# Patient Record
Sex: Female | Born: 1965 | Race: White | Hispanic: No | Marital: Married | Smoking: Never smoker
Health system: Southern US, Community
[De-identification: ages and names within clinical notes are randomized; demographics above are authoritative.]

---

## 2018-03-25 DIAGNOSIS — Z01419 Encounter for gynecological examination (general) (routine) without abnormal findings: Secondary | ICD-10-CM | POA: Diagnosis not present

## 2018-03-25 DIAGNOSIS — B370 Candidal stomatitis: Secondary | ICD-10-CM | POA: Diagnosis not present

## 2018-03-25 DIAGNOSIS — Z124 Encounter for screening for malignant neoplasm of cervix: Secondary | ICD-10-CM | POA: Diagnosis not present

## 2018-03-25 DIAGNOSIS — Z6828 Body mass index (BMI) 28.0-28.9, adult: Secondary | ICD-10-CM | POA: Diagnosis not present

## 2018-03-25 DIAGNOSIS — Z1151 Encounter for screening for human papillomavirus (HPV): Secondary | ICD-10-CM | POA: Diagnosis not present

## 2018-04-11 DIAGNOSIS — Z130 Encounter for screening for diseases of the blood and blood-forming organs and certain disorders involving the immune mechanism: Secondary | ICD-10-CM | POA: Diagnosis not present

## 2018-04-11 DIAGNOSIS — Z13220 Encounter for screening for lipoid disorders: Secondary | ICD-10-CM | POA: Diagnosis not present

## 2018-04-11 DIAGNOSIS — Z0000 Encounter for general adult medical examination without abnormal findings: Secondary | ICD-10-CM | POA: Diagnosis not present

## 2018-04-11 DIAGNOSIS — Z131 Encounter for screening for diabetes mellitus: Secondary | ICD-10-CM | POA: Diagnosis not present

## 2018-04-11 DIAGNOSIS — Z1231 Encounter for screening mammogram for malignant neoplasm of breast: Secondary | ICD-10-CM | POA: Diagnosis not present

## 2018-04-11 DIAGNOSIS — Z1329 Encounter for screening for other suspected endocrine disorder: Secondary | ICD-10-CM | POA: Diagnosis not present

## 2018-04-24 ENCOUNTER — Ambulatory Visit (INDEPENDENT_AMBULATORY_CARE_PROVIDER_SITE_OTHER): Payer: BLUE CROSS/BLUE SHIELD

## 2018-04-24 ENCOUNTER — Ambulatory Visit (INDEPENDENT_AMBULATORY_CARE_PROVIDER_SITE_OTHER): Payer: BLUE CROSS/BLUE SHIELD | Admitting: Podiatry

## 2018-04-24 ENCOUNTER — Encounter: Payer: Self-pay | Admitting: Podiatry

## 2018-04-24 DIAGNOSIS — M7751 Other enthesopathy of right foot: Secondary | ICD-10-CM

## 2018-04-24 DIAGNOSIS — M722 Plantar fascial fibromatosis: Secondary | ICD-10-CM | POA: Diagnosis not present

## 2018-04-24 DIAGNOSIS — M779 Enthesopathy, unspecified: Secondary | ICD-10-CM

## 2018-04-24 MED ORDER — MELOXICAM 15 MG PO TABS: 15.0000 mg | ORAL_TABLET | Freq: Every day | ORAL | 0 refills | Status: DC

## 2018-04-24 NOTE — Patient Instructions (Signed)

## 2018-04-24 NOTE — Progress Notes (Signed)
Subjective:    Patient ID: Julia Long, female    DOB: 07-01-1966, 52 y.o.   MRN: [id number]  HPI  52 year old female presents the office of the right foot arch pain.  She states that she broke her ankle about 1 year ago she is having difficulty moving her ankle.  After she broke her ankle she did go to physical therapy but she is not sure if she went long enough.  She states that now both feet are started hurting she points to the heels as well as the arches of both feet.  She also has a bump on the arch of both feet which she has noticed.  The left side started before the right.  Besides the ankle fracture no other recent injury.  She said no recent treatment for this.  No numbness or tingling.  The pain does not wake her up at night.  No other concerns.  Review of Systems  All other systems reviewed and are negative.  History reviewed. No pertinent past medical history.  History reviewed. No pertinent surgical history.   Current Outpatient Medications:  .  meloxicam (MOBIC) 15 MG tablet, Take 1 tablet (15 mg total) by mouth daily., Disp: 30 tablet, Rfl: 0  Not on File       Objective:   Physical Exam General: AAO x3, NAD  Dermatological: Skin is warm, dry and supple bilateral. Nails x 10 are well manicured; remaining integument appears unremarkable at this time. There are no open sores, no preulcerative lesions, no rash or signs of infection present.  Vascular: Dorsalis Pedis artery and Posterior Tibial artery pedal pulses are 2/4 bilateral with immedate capillary fill time. Pedal hair growth present. No varicosities and no lower extremity edema present bilateral. There is no pain with calf compression, swelling, warmth, erythema.   Neruologic: Grossly intact via light touch bilateral.  Protective threshold with Semmes Wienstein monofilament intact to all pedal sites bilateral.   Musculoskeletal: Tenderness to palpation along the plantar medial tubercle of the calcaneus at the  insertion of plantar fascia on the right and left foot. There is mild pain along the course of the plantar fascia within the arch of the foot. Plantar fascia appears to be intact. There is no pain with lateral compression of the calcaneus or pain with vibratory sensation. There is no pain along the course or insertion of the achilles tendon.  Cavus foot type is present.  Ankle joint range of motion is mildly restricted on the right side however there is no crepitation with range of motion.  On the medial band of plantar fascia the arch of the foot bilaterally return from no mobile soft tissue masses consistent with a plantar fibroma.  There is no edema, erythema there is no overlying skin changes present.  No other areas of tenderness to bilateral lower extremities. Muscular strength 5/5 in all groups tested bilateral.  Gait: Unassisted, Nonantalgic.      Assessment & Plan:  52 year old female with bilateral foot pain, plantar fasciitis/plantar fibroma, capsulitis right ankle -Treatment options discussed including all alternatives, risks, and complications -Etiology of symptoms were discussed -X-rays were obtained and reviewed with the patient.  No evidence of acute fracture or stress fracture.  No calcifications or foreign body are present. -She declines steroid injection. -Prescribed mobic. Discussed side effects of the medication and directed to stop if any are to occur and call the office.  -We discussed shoe modifications and orthotics.  Check orthotic coverage. -Plantar fascial brace is  dispensed. -We discussed verapamil cream for the plantar fibroma she wishes to hold off on this -Discussed stretching, icing daily.  Also will start physical therapy.  Prescription for physical therapy was written for [facility]. -RTC 6 weeks or sooner if needed.  Trula Slade DPM

## 2018-04-29 NOTE — Addendum Note (Signed)
Addended by: Harriett Sine D on: 04/29/2018 08:46 AM   Modules accepted: Orders

## 2018-04-30 DIAGNOSIS — M25671 Stiffness of right ankle, not elsewhere classified: Secondary | ICD-10-CM | POA: Diagnosis not present

## 2018-04-30 DIAGNOSIS — M79672 Pain in left foot: Secondary | ICD-10-CM | POA: Diagnosis not present

## 2018-04-30 DIAGNOSIS — M79671 Pain in right foot: Secondary | ICD-10-CM | POA: Diagnosis not present

## 2018-04-30 DIAGNOSIS — M25571 Pain in right ankle and joints of right foot: Secondary | ICD-10-CM | POA: Diagnosis not present

## 2018-05-01 DIAGNOSIS — M25571 Pain in right ankle and joints of right foot: Secondary | ICD-10-CM | POA: Diagnosis not present

## 2018-05-01 DIAGNOSIS — M79671 Pain in right foot: Secondary | ICD-10-CM | POA: Diagnosis not present

## 2018-05-01 DIAGNOSIS — M25671 Stiffness of right ankle, not elsewhere classified: Secondary | ICD-10-CM | POA: Diagnosis not present

## 2018-05-01 DIAGNOSIS — M79672 Pain in left foot: Secondary | ICD-10-CM | POA: Diagnosis not present

## 2018-05-02 ENCOUNTER — Telehealth: Payer: Self-pay | Admitting: Podiatry

## 2018-05-02 NOTE — Telephone Encounter (Signed)
Pt returned call and is aware they are not covered but wants to proceed with orthotics and is aware 398.00. Pt is scheduled to see Liliane Channel on 6.3.19

## 2018-05-02 NOTE — Telephone Encounter (Signed)
Left message for pt that orthotics are not covered unless diabetic and they cost 398.00. Please call if any further questions.

## 2018-05-02 NOTE — Telephone Encounter (Signed)
Thanks

## 2018-05-05 ENCOUNTER — Ambulatory Visit (INDEPENDENT_AMBULATORY_CARE_PROVIDER_SITE_OTHER): Payer: Self-pay | Admitting: Orthotics

## 2018-05-05 DIAGNOSIS — M722 Plantar fascial fibromatosis: Secondary | ICD-10-CM

## 2018-05-05 NOTE — Progress Notes (Signed)
Patient came into today for casting bilateral f/o to address plantar fasciitis.  Patient reports history of foot pain involving plantar aponeurosis.  Goal is to provide longitudinal arch support and correct any RF instability due to heel eversion/inversion.  Ultimate goal is to relieve tension at pf insertion calcaneal tuberosity.  Plan on semi-rigid device addressing heel stability and relieving PF tension.     Also deep heel cup, m/l flange due to high arch supinating type of foot; also hx of ankle fx.

## 2018-05-06 DIAGNOSIS — M79672 Pain in left foot: Secondary | ICD-10-CM | POA: Diagnosis not present

## 2018-05-06 DIAGNOSIS — M25571 Pain in right ankle and joints of right foot: Secondary | ICD-10-CM | POA: Diagnosis not present

## 2018-05-06 DIAGNOSIS — M25671 Stiffness of right ankle, not elsewhere classified: Secondary | ICD-10-CM | POA: Diagnosis not present

## 2018-05-06 DIAGNOSIS — M79671 Pain in right foot: Secondary | ICD-10-CM | POA: Diagnosis not present

## 2018-05-08 DIAGNOSIS — M25571 Pain in right ankle and joints of right foot: Secondary | ICD-10-CM | POA: Diagnosis not present

## 2018-05-08 DIAGNOSIS — M25671 Stiffness of right ankle, not elsewhere classified: Secondary | ICD-10-CM | POA: Diagnosis not present

## 2018-05-08 DIAGNOSIS — M79672 Pain in left foot: Secondary | ICD-10-CM | POA: Diagnosis not present

## 2018-05-08 DIAGNOSIS — M79671 Pain in right foot: Secondary | ICD-10-CM | POA: Diagnosis not present

## 2018-05-13 DIAGNOSIS — M25671 Stiffness of right ankle, not elsewhere classified: Secondary | ICD-10-CM | POA: Diagnosis not present

## 2018-05-13 DIAGNOSIS — M79672 Pain in left foot: Secondary | ICD-10-CM | POA: Diagnosis not present

## 2018-05-13 DIAGNOSIS — M79671 Pain in right foot: Secondary | ICD-10-CM | POA: Diagnosis not present

## 2018-05-13 DIAGNOSIS — M25571 Pain in right ankle and joints of right foot: Secondary | ICD-10-CM | POA: Diagnosis not present

## 2018-05-26 ENCOUNTER — Ambulatory Visit: Payer: BLUE CROSS/BLUE SHIELD | Admitting: Orthotics

## 2018-05-26 DIAGNOSIS — M7751 Other enthesopathy of right foot: Secondary | ICD-10-CM

## 2018-05-26 NOTE — Progress Notes (Signed)
Patient came in today to pick up custom made foot orthotics.  The goals were accomplished and the patient reported no dissatisfaction with said orthotics.  Patient was advised of breakin period and how to report any issues. 

## 2018-05-27 DIAGNOSIS — M25571 Pain in right ankle and joints of right foot: Secondary | ICD-10-CM | POA: Diagnosis not present

## 2018-05-27 DIAGNOSIS — M79672 Pain in left foot: Secondary | ICD-10-CM | POA: Diagnosis not present

## 2018-05-27 DIAGNOSIS — M25671 Stiffness of right ankle, not elsewhere classified: Secondary | ICD-10-CM | POA: Diagnosis not present

## 2018-05-27 DIAGNOSIS — M79671 Pain in right foot: Secondary | ICD-10-CM | POA: Diagnosis not present

## 2018-05-29 DIAGNOSIS — M79671 Pain in right foot: Secondary | ICD-10-CM | POA: Diagnosis not present

## 2018-05-29 DIAGNOSIS — M25571 Pain in right ankle and joints of right foot: Secondary | ICD-10-CM | POA: Diagnosis not present

## 2018-05-29 DIAGNOSIS — M79672 Pain in left foot: Secondary | ICD-10-CM | POA: Diagnosis not present

## 2018-05-29 DIAGNOSIS — M25671 Stiffness of right ankle, not elsewhere classified: Secondary | ICD-10-CM | POA: Diagnosis not present

## 2018-06-09 ENCOUNTER — Ambulatory Visit: Payer: BLUE CROSS/BLUE SHIELD | Admitting: Podiatry

## 2018-06-25 ENCOUNTER — Ambulatory Visit: Payer: BLUE CROSS/BLUE SHIELD | Admitting: Podiatry

## 2018-07-07 ENCOUNTER — Ambulatory Visit (INDEPENDENT_AMBULATORY_CARE_PROVIDER_SITE_OTHER): Payer: BLUE CROSS/BLUE SHIELD | Admitting: Podiatry

## 2018-07-07 ENCOUNTER — Encounter: Payer: Self-pay | Admitting: Podiatry

## 2018-07-07 DIAGNOSIS — M722 Plantar fascial fibromatosis: Secondary | ICD-10-CM

## 2018-07-07 MED ORDER — MELOXICAM 15 MG PO TABS: 15.0000 mg | ORAL_TABLET | Freq: Every day | ORAL | 0 refills | Status: DC

## 2018-07-08 DIAGNOSIS — M25571 Pain in right ankle and joints of right foot: Secondary | ICD-10-CM | POA: Diagnosis not present

## 2018-07-08 DIAGNOSIS — M79672 Pain in left foot: Secondary | ICD-10-CM | POA: Diagnosis not present

## 2018-07-08 DIAGNOSIS — M79671 Pain in right foot: Secondary | ICD-10-CM | POA: Diagnosis not present

## 2018-07-08 DIAGNOSIS — M25671 Stiffness of right ankle, not elsewhere classified: Secondary | ICD-10-CM | POA: Diagnosis not present

## 2018-07-09 DIAGNOSIS — M722 Plantar fascial fibromatosis: Secondary | ICD-10-CM | POA: Insufficient documentation

## 2018-07-09 NOTE — Progress Notes (Signed)
Subjective: 52 year old female presents the office today for follow-up evaluation of bladder fasciitis on the right side.  She says that she is doing physical therapy and she was doing well but she has been in [place] for 7 weeks helping her brother renovate a house and so therefore she has not been doing exercises and not taking care of her feet.  She does have a new prescription for physical therapy as she is feels that her right foot feels weak.  Also she is asking for refill the meloxicam is been very helpful for her.  Overall her pain is better but she still gets some discomfort.  She has no new concerns otherwise no recent injury. Denies any systemic complaints such as fevers, chills, nausea, vomiting. No acute changes since last appointment, and no other complaints at this time.   Objective: AAO x3, NAD DP/PT pulses palpable bilaterally, CRT less than 3 seconds There is tenderness palpation on the plantar medial tubercle of the calcaneus at the insertion of the plantar fascia on the feet.  No pain on the arch of the foot.  Achilles tendon appears to be intact.  Flexor, extensor tendons intact.  There is no edema, erythema, increase in warmth.  Negative Tinel sign. No open lesions or pre-ulcerative lesions.  No pain with calf compression, swelling, warmth, erythema  Assessment: Bilateral heel pain, plantar fasciitis  Plan: -All treatment options discussed with the patient including all alternatives, risks, complications.  -At this time she wants to continue physical therapy given the "weakness" on the right side.  New prescription was given to [facility] and she is scheduled to start that this week.  Also refilled the meloxicam for her to take as needed.  Continue home stretching, icing as well as wearing supportive shoes and orthotics. -Patient encouraged to call the office with any questions, concerns, change in symptoms.   Trula Slade DPM

## 2018-07-10 DIAGNOSIS — M79672 Pain in left foot: Secondary | ICD-10-CM | POA: Diagnosis not present

## 2018-07-10 DIAGNOSIS — M25671 Stiffness of right ankle, not elsewhere classified: Secondary | ICD-10-CM | POA: Diagnosis not present

## 2018-07-10 DIAGNOSIS — M25571 Pain in right ankle and joints of right foot: Secondary | ICD-10-CM | POA: Diagnosis not present

## 2018-07-10 DIAGNOSIS — M79671 Pain in right foot: Secondary | ICD-10-CM | POA: Diagnosis not present

## 2018-07-22 DIAGNOSIS — M25571 Pain in right ankle and joints of right foot: Secondary | ICD-10-CM | POA: Diagnosis not present

## 2018-07-22 DIAGNOSIS — M79671 Pain in right foot: Secondary | ICD-10-CM | POA: Diagnosis not present

## 2018-07-22 DIAGNOSIS — M79672 Pain in left foot: Secondary | ICD-10-CM | POA: Diagnosis not present

## 2018-07-22 DIAGNOSIS — M25671 Stiffness of right ankle, not elsewhere classified: Secondary | ICD-10-CM | POA: Diagnosis not present

## 2018-08-03 ENCOUNTER — Other Ambulatory Visit: Payer: Self-pay | Admitting: Podiatry

## 2018-09-26 ENCOUNTER — Ambulatory Visit (INDEPENDENT_AMBULATORY_CARE_PROVIDER_SITE_OTHER): Payer: BLUE CROSS/BLUE SHIELD | Admitting: Podiatry

## 2018-09-26 DIAGNOSIS — M722 Plantar fascial fibromatosis: Secondary | ICD-10-CM

## 2018-09-26 MED ORDER — MELOXICAM 15 MG PO TABS: 15.0000 mg | ORAL_TABLET | Freq: Every day | ORAL | 0 refills | Status: DC

## 2018-09-28 NOTE — Progress Notes (Signed)
Subjective: 52 year old female presents the office today for follow-up evaluation of plantar fasciitis.  She states that she was not consistent with her treatment previously and she still having some pain.  She states is been wearing inserts.  She states that she still feels tight and she was seen by the physical therapy.  She also states that she wears the orthotics but she is not sure how comfortable they are.  She does not wear them all the time.  Denies any systemic complaints such as fevers, chills, nausea, vomiting. No acute changes since last appointment, and no other complaints at this time.   Objective: AAO x3, NAD DP/PT pulses palpable bilaterally, CRT less than 3 seconds Tenderness to palpation along the plantar medial tubercle of the calcaneus at the insertion of plantar fascia on the right foot. There is no pain along the course of the plantar fascia within the arch of the foot. Plantar fascia appears to be intact. There is no pain with lateral compression of the calcaneus or pain with vibratory sensation. There is no pain along the course or insertion of the achilles tendon. No other areas of tenderness to bilateral lower extremities. Negative Tinel sign No open lesions or pre-ulcerative lesions.  No pain with calf compression, swelling, warmth, erythema  Assessment: Plantar fasciitis  Plan: -All treatment options discussed with the patient including all alternatives, risks, complications.  -Discussion regards to treatment options.  I want her to bring the orthotics back into see her back for possible modifications.  Also she was about a physical therapy and a prescription for [facility] was written.  She states that she is doing more consistent with treatment at this point.  We held off on an injection.  Refilled meloxicam to use as needed.  Continue plantar fascial brace as needed known her to continue with stretching, rehab exercises at home until she gets into  physical therapy. -Patient encouraged to call the office with any questions, concerns, change in symptoms.  -RTC 6 weeks or sooner if needed  Trula Slade DPM

## 2018-11-07 ENCOUNTER — Encounter: Payer: Self-pay | Admitting: Podiatry

## 2018-11-07 ENCOUNTER — Ambulatory Visit (INDEPENDENT_AMBULATORY_CARE_PROVIDER_SITE_OTHER): Payer: BLUE CROSS/BLUE SHIELD | Admitting: Podiatry

## 2018-11-07 DIAGNOSIS — D3610 Benign neoplasm of peripheral nerves and autonomic nervous system, unspecified: Secondary | ICD-10-CM | POA: Diagnosis not present

## 2018-11-07 DIAGNOSIS — M722 Plantar fascial fibromatosis: Secondary | ICD-10-CM | POA: Diagnosis not present

## 2018-11-09 NOTE — Progress Notes (Signed)
Subjective: 52 year old female presents the office today for follow-up evaluation of plantar fasciitis.  She states that her heels about 95% better physical therapy is been very helpful for her.  However she has new concerns over some burning to her third and fourth toes on the left foot as well.  She states that her physical therapist told her she might have a neuroma and she wants me to check this.  Her therapist has been working on this as well and the symptoms have been improving. Denies any systemic complaints such as fevers, chills, nausea, vomiting. No acute changes since last appointment, and no other complaints at this time.   Objective: AAO x3, NAD DP/PT pulses palpable bilaterally, CRT less than 3 seconds There is very minimal to no discomfort on the plantar medial tubercle of the calcaneus at the insertion of plantar fashion on the left foot.  No pain with lateral compression of the calcaneus.  No pain along the Achilles tendon.  There is a small palpable neuroma identified in the third interspace of the left foot.  There is no area pinpoint tenderness.  No edema. The plantar fibromas are much improved as well and no pain.  No open lesions or pre-ulcerative lesions.  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  -Overall she is doing much better.  I discussed a steroid injection for the neuroma but she wishes to hold off on this.  She states is getting better with the physical therapy.  Continue therapy for both issues.  I will follow-up with her after the physical therapy or sooner if any issues are to arise if there is any worsening.  She agrees with this plan and she has no further questions or concerns today. -Patient encouraged to call the office with any questions, concerns, change in  symptoms.   Trula Slade DPM

## 2018-11-24 ENCOUNTER — Other Ambulatory Visit: Payer: Self-pay | Admitting: Podiatry

## 2019-08-13 ENCOUNTER — Ambulatory Visit (INDEPENDENT_AMBULATORY_CARE_PROVIDER_SITE_OTHER): Payer: BC Managed Care – PPO

## 2019-08-13 ENCOUNTER — Other Ambulatory Visit: Payer: Self-pay

## 2019-08-13 ENCOUNTER — Ambulatory Visit (INDEPENDENT_AMBULATORY_CARE_PROVIDER_SITE_OTHER): Payer: BC Managed Care – PPO | Admitting: Podiatry

## 2019-08-13 DIAGNOSIS — M7751 Other enthesopathy of right foot: Secondary | ICD-10-CM

## 2019-08-13 DIAGNOSIS — M25571 Pain in right ankle and joints of right foot: Secondary | ICD-10-CM

## 2019-08-13 DIAGNOSIS — D3610 Benign neoplasm of peripheral nerves and autonomic nervous system, unspecified: Secondary | ICD-10-CM

## 2019-08-14 ENCOUNTER — Telehealth: Payer: Self-pay | Admitting: *Deleted

## 2019-08-14 DIAGNOSIS — M25571 Pain in right ankle and joints of right foot: Secondary | ICD-10-CM

## 2019-08-14 NOTE — Progress Notes (Signed)
Subjective: 52 year old female presents the office today for concerns of stiffness and pain in the right ankle joint.  She states that she had an ankle fracture about 2 years ago while in [place] that underwent open reduction internal fixation.  I been seeing him for plantar fasciitis/year as well as 1 Morton's neuroma.  She is doing physical therapy which is very helpful however the last several months she has not been doing therapy and she thinks this is why very stiff.  She describes some discomfort upon dorsiflexion of the ankle joint.  Still has some discomfort at the neuroma. Denies any systemic complaints such as fevers, chills, nausea, vomiting. No acute changes since last appointment, and no other complaints at this time.   Objective: AAO x3, NAD DP/PT pulses palpable bilaterally, CRT less than 3 seconds There is tenderness on the anterior aspect of the ankle joint there is tenderness on dorsiflexion of the ankle joint.  This is more than range of motion.  There is no pain at medial, lateral malleoli or along the Achilles tendon.  Flexor, extensor tendons appear to be intact.  Mild discomfort in the foot on the Morton's neuroma.  No other areas of tenderness elicited at this time. No pain with calf compression, swelling, warmth, erythema  Assessment: Right ankle joint stiffness/pain, rule out osteochondral lesion, structural issue causing restriction of range of motion  Plan: -All treatment options discussed with the patient including all alternatives, risks, complications.  -X-rays reviewed.  Fracture is healed.  Hardware intact ankle.  Decreasing the joint space on both the medial lateral gutters.  Difficult to evaluate anterior ankle joint given hardware -Given her ongoing pain despite doing physical therapy, shoe modifications will order CT scan of the ankle in order to evaluate the ankle joint.  This is to the osteochondral lesion as well as possible spurring of the  anterior aspect of the ankle joint causing a structural reason why there is restriction of motion and pain in range of motion. -Patient encouraged to call the office with any questions, concerns, change in symptoms.   *If we go back to physical therapy we will do [facility].  Also we discussed dry needling for the neuroma.  Trula Slade DPM

## 2019-08-14 NOTE — Telephone Encounter (Signed)
Orders to Gretta Arab, RN for pre-cert, and faxed to [facility].

## 2019-08-14 NOTE — Telephone Encounter (Signed)
-----   Message from Trula Slade, DPM sent at 08/14/2019  7:11 AM EDT ----- Can you please order a CT scan of the right ankle? Previous ankle fracture with ORIF with continued pain, stiffness. She has tried PT, shoe changes. Thanks.

## 2019-08-24 ENCOUNTER — Telehealth: Payer: Self-pay | Admitting: *Deleted

## 2019-08-24 NOTE — Telephone Encounter (Signed)
Called and spoke with Melissa from [facility] and she stated that the CT authorization was exempt from the insurance company due to the company makes own rules and doesn't utilizes the vendor and the reference number is [id number]. Lattie Haw

## 2019-08-27 ENCOUNTER — Ambulatory Visit
Admission: RE | Admit: 2019-08-27 | Discharge: 2019-08-27 | Disposition: A | Discharge status: Home / Self Care | Payer: BC Managed Care – PPO | Source: Ambulatory Visit | Attending: Podiatry | Admitting: Podiatry

## 2019-08-27 DIAGNOSIS — M25571 Pain in right ankle and joints of right foot: Secondary | ICD-10-CM

## 2019-08-31 ENCOUNTER — Telehealth: Payer: Self-pay | Admitting: *Deleted

## 2019-08-31 NOTE — Telephone Encounter (Signed)
I reviewed pt's LOV 08/13/2019 and Dr. Leigh Aurora explanation of pt's current foot and ankle problem was clearly stated with his recommended treatment. I called pt and asked if I could send a copy of the [place] 08/13/2019 note. Pt states that would be fine. Faxed orders and LOV 08/13/2019 to [facility].

## 2019-08-31 NOTE — Telephone Encounter (Signed)
Pt called for results.

## 2019-08-31 NOTE — Telephone Encounter (Signed)
I informed pt of Dr. Leigh Aurora review of results and pt states understanding.

## 2019-08-31 NOTE — Telephone Encounter (Signed)
Left message for pt to call for CT results.

## 2019-08-31 NOTE — Telephone Encounter (Signed)
-----   Message from Trula Slade, DPM sent at 08/28/2019  7:03 AM EDT ----- Val- please let her know that CT scan showed healed fractures. There is a small bone fragmenet in the ankle joint but that should not be causing the restriction. I would like to re-start PT and I think she is in agreement to this but I wanted to check a CT before doing this. Can you please let her know the results and put in a referral for [facility]? She didn't want to use [facility].

## 2019-10-28 ENCOUNTER — Encounter: Payer: Self-pay | Admitting: Cardiology

## 2019-10-28 ENCOUNTER — Ambulatory Visit (INDEPENDENT_AMBULATORY_CARE_PROVIDER_SITE_OTHER): Payer: BC Managed Care – PPO | Admitting: Cardiology

## 2019-10-28 ENCOUNTER — Other Ambulatory Visit: Payer: Self-pay

## 2019-10-28 VITALS — BP 137/77 | HR 65 | Ht 69.0 in | Wt 188.4 lb

## 2019-10-28 DIAGNOSIS — R002 Palpitations: Secondary | ICD-10-CM | POA: Diagnosis not present

## 2019-10-28 DIAGNOSIS — Z7189 Other specified counseling: Secondary | ICD-10-CM | POA: Diagnosis not present

## 2019-10-28 NOTE — Patient Instructions (Signed)
Medication Instructions:  Your Physician recommend you continue on your current medication as directed.    *If you need a refill on your cardiac medications before your next appointment, please call your pharmacy*  Lab Work: None  Testing/Procedures: Our physician has recommended that you wear an [place] monitor. The Zio patch cardiac monitor continuously records heart rhythm data for up to 14 days, this is for patients being evaluated for multiple types heart rhythms. For the first 24 hours post application, please avoid getting the Zio monitor wet in the shower or by excessive sweating during exercise. After that, feel free to carry on with regular activities. Keep soaps and lotions away from the ZIO XT Patch.   Someone will call you to have monitor mailed.   Follow-Up: At [facility], you and your health needs are our priority.  As part of our continuing mission to provide you with exceptional heart care, we have created designated Provider Care Teams.  These Care Teams include your primary Cardiologist (physician) and Advanced Practice Providers (APPs -  Physician Assistants and Nurse Practitioners) who all work together to provide you with the care you need, when you need it.  Your next appointment:   As needed  The format for your next appointment:   Either In Person or Virtual  Provider:   Buford Dresser, MD   [place] Instructions   Your physician has requested you wear your ZIO patch monitor__14_____days.   This is a single patch monitor.  [employer] supplies one patch monitor per enrollment.  Additional stickers are not available.   Please do not apply patch if you will be having a Nuclear Stress Test, Echocardiogram, Cardiac CT, MRI, or Chest Xray during the time frame you would be wearing the monitor. The patch cannot be worn during these tests.  You cannot remove and re-apply the ZIO XT patch monitor.   Your ZIO patch monitor will be  sent USPS Priority mail from [facility] directly to your home address. The monitor may also be mailed to a PO BOX if home delivery is not available.   It may take 3-5 days to receive your monitor after you have been enrolled.   Once you have received you monitor, please review enclosed instructions.  Your monitor has already been registered assigning a specific monitor serial # to you.   Applying the monitor   Shave hair from upper left chest.   Hold abrader disc by orange tab.  Rub abrader in 40 strokes over left upper chest as indicated in your monitor instructions.   Clean area with 4 enclosed alcohol pads .  Use all pads to assure are is cleaned thoroughly.  Let dry.   Apply patch as indicated in monitor instructions.  Patch will be place under collarbone on left side of chest with arrow pointing upward.   Rub patch adhesive wings for 2 minutes.Remove white label marked "1".  Remove white label marked "2".  Rub patch adhesive wings for 2 additional minutes.   While looking in a mirror, press and release button in center of patch.  A small green light will flash 3-4 times .  This will be your only indicator the monitor has been turned on.     Do not shower for the first 24 hours.  You may shower after the first 24 hours.   Press button if you feel a symptom. You will hear a small click.  Record Date, Time and Symptom  in the Patient Log Book.   When you are ready to remove patch, follow instructions on last 2 pages of Patient Log Book.  Stick patch monitor onto last page of Patient Log Book.   Place Patient Log Book in [place] box.  Use locking tab on box and tape box closed securely.  The [employer] and [employer] has [employer] on it.  Please place in mailbox as soon as possible.  Your physician should have your test results approximately 7 days after the monitor has been mailed back to [facility].   Call [facility] at [phone] if you have  questions regarding your ZIO XT patch monitor.  Call them immediately if you see an orange light blinking on your monitor.   If your monitor falls off in less than 4 days contact our Monitor department at [phone].  If your monitor becomes loose or falls off after 4 days call [employer] at [phone] for suggestions on securing your monitor.

## 2019-10-28 NOTE — Progress Notes (Signed)
Cardiology Office Note:    Date:  10/28/2019   ID:  Julia Long, DOB [birth year], MRN [id number]  PCP:  Patient, No Pcp Per  Cardiologist:  Buford Dresser, MD  Referring MD: No ref. provider found   CC: new patient/establish care  History of Present Illness:    Julia Long is a 53 y.o. female with a hx of palpitations who is seen as a new patient for the evaluation and management of palpitaitons.  Was seen at [facility] for general checkup/ECG recently. No records available. Was seen 3-4 years ago in [facility] but she cannot recall where it was to get records. Has tests 4 years ago for similar episode of palpitations and was told things were fine. Had stress test and monitor done.   Her main concern is palpitations. These are not consistent, occur at rest. Sometimes every day, then sometimes not for days. "Feels like adrenaline got kicked on and can't shut off."  Started back up about 6-9 mos ago. Longest lasting is 1-2 hours, comes and goes. Better with getting up and moving around. No syncope. Denies personal history of cardiac issues  Denies thyroid issues, diabetes, CKD, etc. Diet has been poor, walks for exercise, working on improving this.   Family history: mat gpa died in his 29s of MI. No other heart disease.  Pandemic has been stressful for her.   Has some minor vertigo with changing position (has had in the past). ROS otherwise negative.  Denies chest pain, shortness of breath at rest or with normal exertion. No PND, orthopnea, LE edema or unexpected weight gain. No syncope or palpitations.  History reviewed. No pertinent past medical history.  History reviewed. No pertinent surgical history.  Current Medications: Current Outpatient Medications on File Prior to Visit  Medication Sig  . cholecalciferol (VITAMIN D3) 25 MCG (1000 UT) tablet Take 1,000 Units by mouth daily.   No current facility-administered medications on file prior to visit.      Allergies:    Patient has no known allergies.   Social History   Tobacco Use  . Smoking status: Never Smoker  . Smokeless tobacco: Never Used  Substance Use Topics  . Alcohol use: Yes    Alcohol/week: 2.0 standard drinks    Types: 2 Glasses of wine per week  . Drug use: Not on file    Family History: family history includes Heart attack in her maternal grandfather.  ROS:   Please see the history of present illness.  Additional pertinent ROS: Constitutional: Negative for chills, fever, night sweats, unintentional weight loss  HENT: Negative for ear pain and hearing loss.   Eyes: Negative for loss of vision and eye pain.  Respiratory: Negative for cough, sputum, wheezing.   Cardiovascular: See HPI. Gastrointestinal: Negative for abdominal pain, melena, and hematochezia.  Genitourinary: Negative for dysuria and hematuria.  Musculoskeletal: Negative for falls and myalgias.  Skin: Negative for itching and rash.  Neurological: Negative for focal weakness, focal sensory changes and loss of consciousness.  Endo/Heme/Allergies: Does not bruise/bleed easily.     EKGs/Labs/Other Studies Reviewed:    The following studies were reviewed today: No prior studies available  EKG:  EKG is personally reviewed.  The ekg ordered today demonstrates NSR  Recent Labs: No results found for requested labs within last 8760 hours.  Recent Lipid Panel No results found for: CHOL, TRIG, HDL, CHOLHDL, VLDL, LDLCALC, LDLDIRECT  Physical Exam:    VS:  BP 137/77   Pulse 65   Ht 5'  9" (1.753 m)   Wt 188 lb 6.4 oz (85.5 kg)   SpO2 98%   BMI 27.82 kg/m     Wt Readings from Last 3 Encounters:  10/28/19 188 lb 6.4 oz (85.5 kg)    GEN: Well nourished, well developed in no acute distress HEENT: Normal, moist mucous membranes NECK: No JVD CARDIAC: regular rhythm, normal S1 and S2, no rubs or gallops. No murmurs. VASCULAR: Radial and DP pulses 2+ bilaterally. No carotid bruits RESPIRATORY:  Clear to auscultation  without rales, wheezing or rhonchi  ABDOMEN: Soft, non-tender, non-distended MUSCULOSKELETAL:  Ambulates independently SKIN: Warm and dry, no edema NEUROLOGIC:  Alert and oriented x 3. No focal neuro deficits noted. PSYCHIATRIC:  Normal affect    ASSESSMENT:    1. Palpitation   2. Cardiac risk counseling   3. Counseling on health promotion and disease prevention    PLAN:    Palpitations: discussed possible etiologies at length today, as well as ways of evaluting -2 week Zio, echo if abnormal -if no significant rhythm abnormalities, likely noncardiac in etiology  Cardiac risk counseling and prevention recommendations: -recommend heart healthy/Mediterranean diet, with whole grains, fruits, vegetable, fish, lean meats, nuts, and olive oil. Limit salt. -recommend moderate walking, 3-5 times/week for 30-50 minutes each session. Aim for at least 150 minutes.week. Goal should be pace of 3 miles/hours, or walking 1.5 miles in 30 minutes -recommend avoidance of tobacco products. Avoid excess alcohol. -ASCVD risk score: The ASCVD Risk score Mikey Bussing DC Jr., et al., 2013) failed to calculate for the following reasons:   Cannot find a previous HDL lab   Cannot find a previous total cholesterol lab    Plan for follow up: PRN if monitor normal  Medication Adjustments/Labs and Tests Ordered: Current medicines are reviewed at length with the patient today.  Concerns regarding medicines are outlined above.  Orders Placed This Encounter  Procedures  . LONG TERM MONITOR (3-14 DAYS)  . EKG 12-Lead   No orders of the defined types were placed in this encounter.   Patient Instructions  Medication Instructions:  Your Physician recommend you continue on your current medication as directed.    *If you need a refill on your cardiac medications before your next appointment, please call your pharmacy*  Lab Work: None  Testing/Procedures: Our physician has recommended that you wear an [place] monitor. The Zio patch cardiac monitor continuously records heart rhythm data for up to 14 days, this is for patients being evaluated for multiple types heart rhythms. For the first 24 hours post application, please avoid getting the Zio monitor wet in the shower or by excessive sweating during exercise. After that, feel free to carry on with regular activities. Keep soaps and lotions away from the ZIO XT Patch.   Someone will call you to have monitor mailed.   Follow-Up: At [facility], you and your health needs are our priority.  As part of our continuing mission to provide you with exceptional heart care, we have created designated Provider Care Teams.  These Care Teams include your primary Cardiologist (physician) and Advanced Practice Providers (APPs -  Physician Assistants and Nurse Practitioners) who all work together to provide you with the care you need, when you need it.  Your next appointment:   As needed  The format for your next appointment:   Either In Person or Virtual  Provider:   Buford Dresser, MD   [place] Instructions   Your physician  has requested you wear your ZIO patch monitor__14_____days.   This is a single patch monitor.  [employer] supplies one patch monitor per enrollment.  Additional stickers are not available.   Please do not apply patch if you will be having a Nuclear Stress Test, Echocardiogram, Cardiac CT, MRI, or Chest Xray during the time frame you would be wearing the monitor. The patch cannot be worn during these tests.  You cannot remove and re-apply the ZIO XT patch monitor.   Your ZIO patch monitor will be sent USPS Priority mail from [facility] directly to your home address. The monitor may also be mailed to a PO BOX if home delivery is not available.   It may take 3-5 days to receive your monitor after you have been enrolled.   Once you have received you monitor, please review enclosed instructions.   Your monitor has already been registered assigning a specific monitor serial # to you.   Applying the monitor   Shave hair from upper left chest.   Hold abrader disc by orange tab.  Rub abrader in 40 strokes over left upper chest as indicated in your monitor instructions.   Clean area with 4 enclosed alcohol pads .  Use all pads to assure are is cleaned thoroughly.  Let dry.   Apply patch as indicated in monitor instructions.  Patch will be place under collarbone on left side of chest with arrow pointing upward.   Rub patch adhesive wings for 2 minutes.Remove white label marked "1".  Remove white label marked "2".  Rub patch adhesive wings for 2 additional minutes.   While looking in a mirror, press and release button in center of patch.  A small green light will flash 3-4 times .  This will be your only indicator the monitor has been turned on.     Do not shower for the first 24 hours.  You may shower after the first 24 hours.   Press button if you feel a symptom. You will hear a small click.  Record Date, Time and Symptom in the Patient Log Book.   When you are ready to remove patch, follow instructions on last 2 pages of Patient Log Book.  Stick patch monitor onto last page of Patient Log Book.   Place Patient Log Book in [place] box.  Use locking tab on box and tape box closed securely.  The [employer] and [employer] has [employer] on it.  Please place in mailbox as soon as possible.  Your physician should have your test results approximately 7 days after the monitor has been mailed back to [facility].   Call [place] at [phone] if you have questions regarding your ZIO XT patch monitor.  Call them immediately if you see an orange light blinking on your monitor.   If your monitor falls off in less than 4 days contact our Monitor department at [phone].  If your monitor becomes loose or falls off after 4 days call [employer] at [phone] for  suggestions on securing your monitor.        Signed, Buford Dresser, MD PhD 10/28/2019 9:20 AM    [place]

## 2019-11-02 ENCOUNTER — Encounter: Payer: Self-pay | Admitting: *Deleted

## 2019-11-02 NOTE — Progress Notes (Signed)
Patient ID: Julia Long, female   DOB: 1965-12-09, 53 y.o.   MRN: [id number]  14 day ZIO XT long term holter monitor to be mailed to the patients home.

## 2019-11-05 ENCOUNTER — Other Ambulatory Visit: Payer: Self-pay

## 2019-11-05 DIAGNOSIS — Z20822 Contact with and (suspected) exposure to covid-19: Secondary | ICD-10-CM

## 2019-11-09 LAB — NOVEL CORONAVIRUS, NAA: SARS-CoV-2, NAA: DETECTED — AB

## 2019-11-10 ENCOUNTER — Telehealth: Payer: Self-pay

## 2019-11-10 ENCOUNTER — Encounter: Payer: Self-pay | Admitting: *Deleted

## 2019-11-10 NOTE — Telephone Encounter (Signed)
Pt notified of positive COVID-19 test results. Pt verbalized understanding. Pt reports that she has cold symptoms.Pt advised to remain in self quarantine until at least 10 days since symptom onset And 3 consecutive days fever free without antipyretics And improvement in respiratory symptoms. Patient advised to utilize over the counter medications to treat symptoms. Pt advised to seek treatment in the ED if respiratory issues/distress develops.Pt advised they should only leave home to seek and medical care and must wear a mask in public. Pt instructed to limit contact with family members or caregivers in the home. Pt advised to practice social distancing and to continue to use good preventative care measures such has frequent hand washing, staying out of crowds and cleaning hard surfaces frequently touched in the home.Pt informed that the health department will likely follow up and may have additional recommendations. Will notify [facility] Department.

## 2019-11-11 ENCOUNTER — Ambulatory Visit (INDEPENDENT_AMBULATORY_CARE_PROVIDER_SITE_OTHER): Payer: BC Managed Care – PPO

## 2019-11-11 DIAGNOSIS — R002 Palpitations: Secondary | ICD-10-CM | POA: Diagnosis not present

## 2020-03-31 ENCOUNTER — Ambulatory Visit: Payer: BC Managed Care – PPO | Admitting: Podiatry

## 2021-01-19 ENCOUNTER — Ambulatory Visit: Payer: Self-pay

## 2021-01-19 ENCOUNTER — Encounter: Payer: Self-pay | Admitting: Orthopedic Surgery

## 2021-01-19 ENCOUNTER — Ambulatory Visit: Payer: BC Managed Care – PPO | Admitting: Orthopedic Surgery

## 2021-01-19 DIAGNOSIS — M25561 Pain in right knee: Secondary | ICD-10-CM | POA: Diagnosis not present

## 2021-01-19 DIAGNOSIS — M25562 Pain in left knee: Secondary | ICD-10-CM

## 2021-01-19 DIAGNOSIS — M25532 Pain in left wrist: Secondary | ICD-10-CM

## 2021-01-19 DIAGNOSIS — G8929 Other chronic pain: Secondary | ICD-10-CM

## 2021-01-19 NOTE — Progress Notes (Signed)
Office Visit Note   Patient: Julia Long           Date of Birth: December 13, 1965           MRN: [id number] Visit Date: 01/19/2021              Requested by: No referring provider defined for this encounter. PCP: Patient, No Pcp Per  Chief Complaint  Patient presents with  . Right Knee - Pain  . Left Knee - Pain  . Left Wrist - Pain      HPI: Patient is a 55 year old woman who presents complaining of bilateral patellofemoral pain as well as pain over the first dorsal extensor compartment of the left wrist.  Patient states she dropped a water bottle on her wrist and states that the knee pain started after she has started exercising with a trainer.  Assessment & Plan: Visit Diagnoses:  1. Chronic pain of both knees   2. Pain in left wrist     Plan: Patient was given instructions and demonstrated isometric single leg exercises as well as 6 dynamic single-leg exercises to work on the patellofemoral strengthening.  Recommended Voltaren gel for the wrist for the irritation of the first dorsal extensor compartment.  Follow-Up Instructions: Return if symptoms worsen or fail to improve.   Ortho Exam  Patient is alert, oriented, no adenopathy, well-dressed, normal affect, normal respiratory effort. Examination of the left wrist she has no pain to palpation of the scaphoid's Aveline or TFCC.  She has some mild tenderness to palpation over the first dorsal extensor compartment the superficial radial nerve is nontender to palpation.  Examination of both knees she has some mild crepitation in the patellofemoral joint with range of motion the patella tracks midline collaterals and cruciates are stable there is no effusion no redness no abrasions.  Imaging: XR Knee 1-2 Views Left  Result Date: 01/19/2021 2 view radiographs of the left knee shows a congruent joint no periarticular bony spurs no subchondral cysts.  XR Knee 1-2 Views Right  Result Date: 01/19/2021 2 view radiographs of the  right knee shows a congruent joint no bone spurs no periarticular cystic changes or sclerotic changes  XR Wrist 2 Views Left  Result Date: 01/19/2021 2 view radiographs of the left wrist shows normal anatomy without fractures.  No images are attached to the encounter.  Labs: No results found for: HGBA1C, ESRSEDRATE, CRP, LABURIC, REPTSTATUS, GRAMSTAIN, CULT, LABORGA   No results found for: ALBUMIN, PREALBUMIN, LABURIC  No results found for: MG No results found for: VD25OH  No results found for: PREALBUMIN No flowsheet data found.   There is no height or weight on file to calculate BMI.  Orders:  Orders Placed This Encounter  Procedures  . XR Knee 1-2 Views Left  . XR Knee 1-2 Views Right  . XR Wrist 2 Views Left   No orders of the defined types were placed in this encounter.    Procedures: No procedures performed  Clinical Data: No additional findings.  ROS:  All other systems negative, except as noted in the HPI. Review of Systems  Objective: Vital Signs: There were no vitals taken for this visit.  Specialty Comments:  No specialty comments available.  PMFS History: Patient Active Problem List   Diagnosis Date Noted  . Plantar fasciitis 07/09/2018   No past medical history on file.  Family History  Problem Relation Age of Onset  . Heart attack Maternal Grandfather     No past  surgical history on file. Social History   Occupational History  . Not on file  Tobacco Use  . Smoking status: Never Smoker  . Smokeless tobacco: Never Used  Substance and Sexual Activity  . Alcohol use: Yes    Alcohol/week: 2.0 standard drinks    Types: 2 Glasses of wine per week  . Drug use: Not on file  . Sexual activity: Not on file

## 2021-02-24 IMAGING — CT CT ANKLE*R* W/O CM
3 series · 12 of 33 positions shown, 14 images · non-contrast
Comparison: 08/13/2019

CLINICAL DATA: History of ankle fracture status post ORIF.
Persistent pain

EXAM:
CT OF THE RIGHT ANKLE WITHOUT CONTRAST
TECHNIQUE: Multidetector CT imaging of the right ankle was performed according
to the standard protocol. Multiplanar CT image reconstructions were
also generated.

[Series 5: sfov lower extremity 2.00 br40 s3 soft (person_nam · axial · 0.21mm/px · z∈[+560,+686]mm · 4 of 91 slices shown, 5 images (1 of 3)]
[im 14/91  soft-tissue]
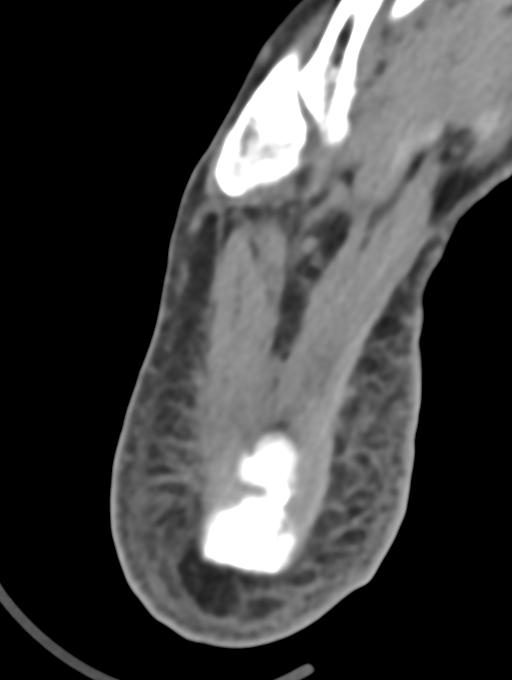
[im 14/91  bone]
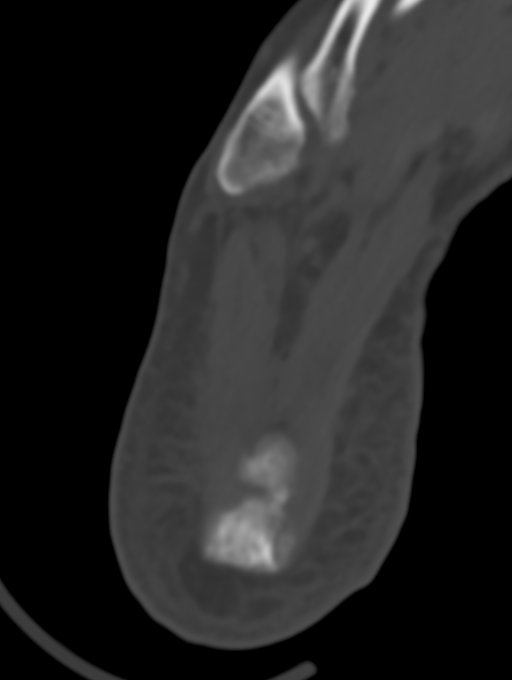
[im 35/91  bone]
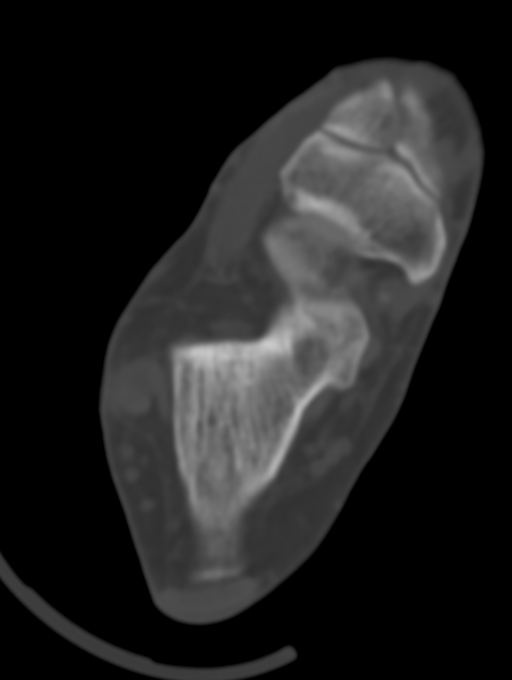
[im 56/91  bone]
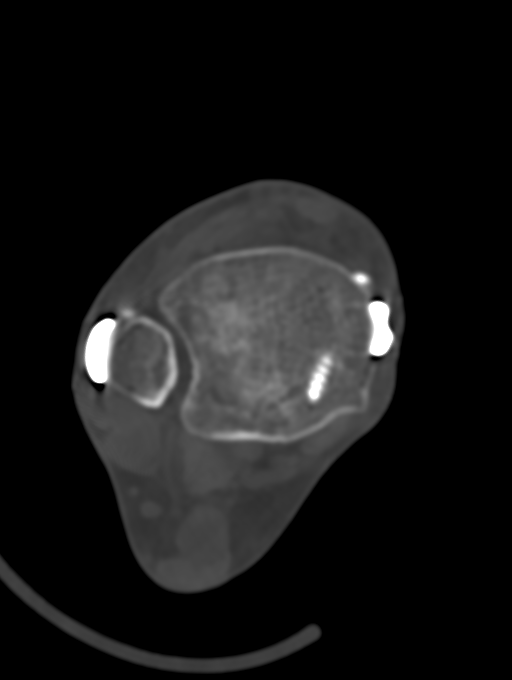
[im 77/91  bone]
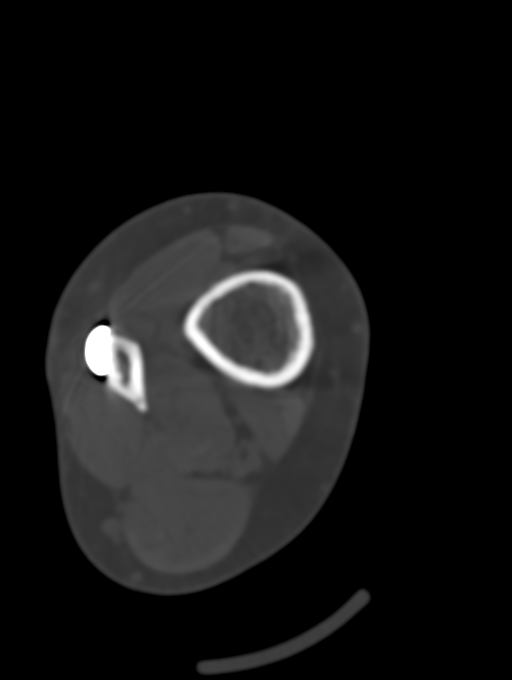

[Series 9: sfov lower extremity 2.00 br40 s3 soft (person_nam · coronal · 0.21mm/px · 3 of 62 slices shown (2 of 3)]
[im 13/62  bone]
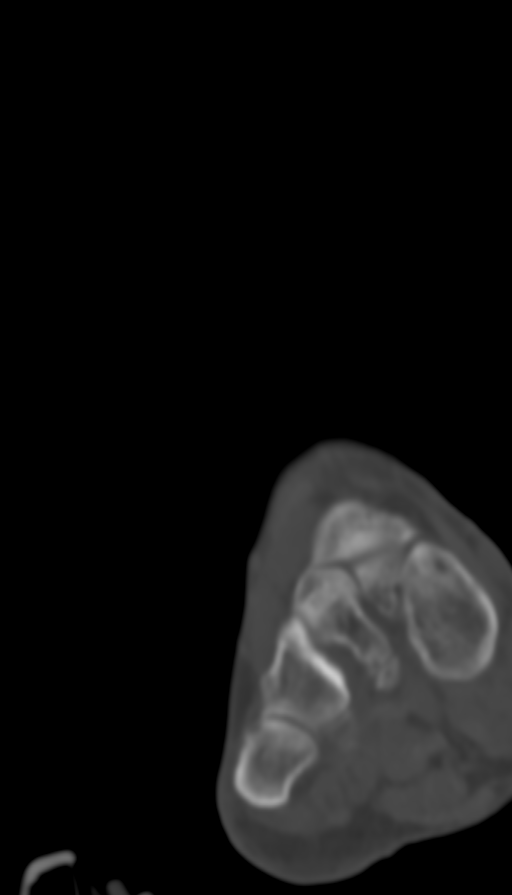
[im 25/62  bone]
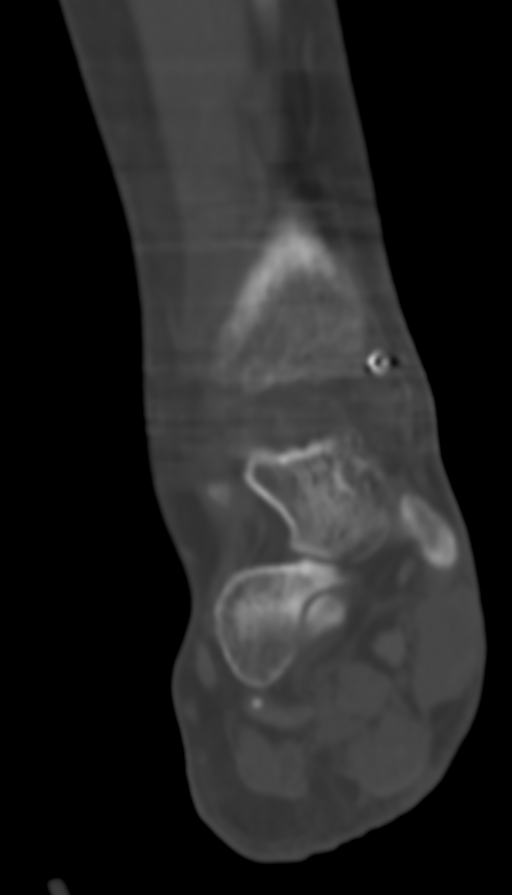
[im 37/62  bone]
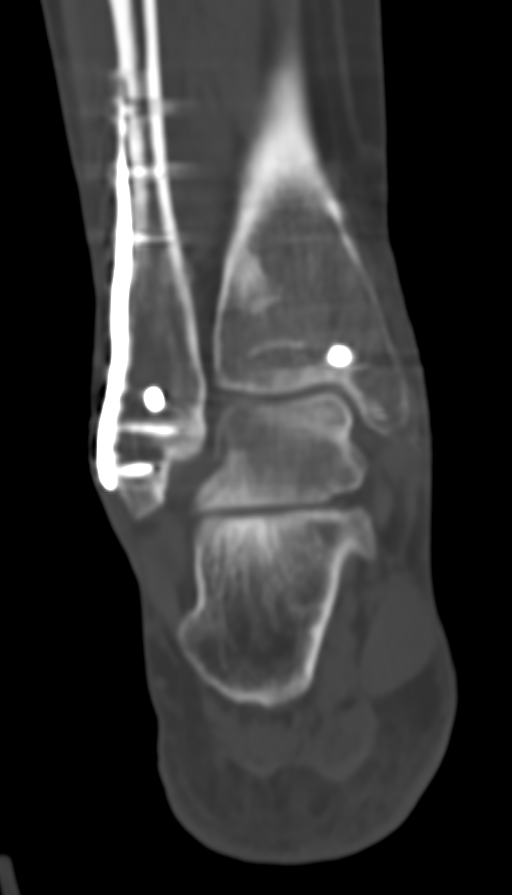

[Series 13: sfov lower extremity 2.00 br40 s3 soft (person_nam · sagittal · 0.27mm/px · 5 of 52 slices shown, 6 images (3 of 3)]
[im 18/52  bone]
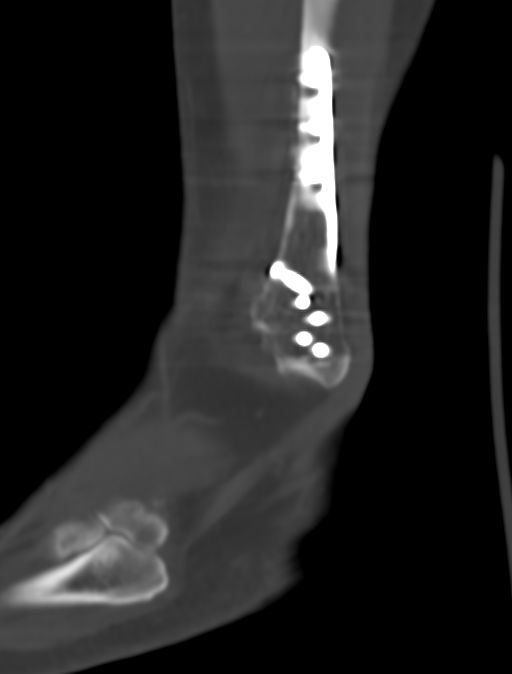
[im 22/52  bone]
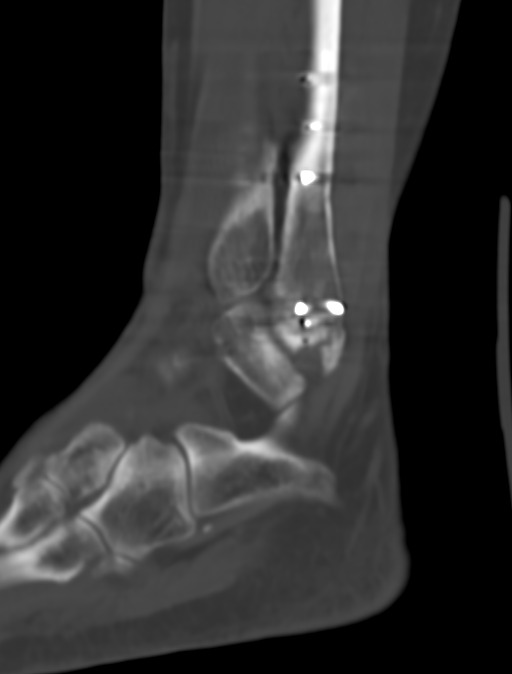
[im 26/52  soft-tissue]
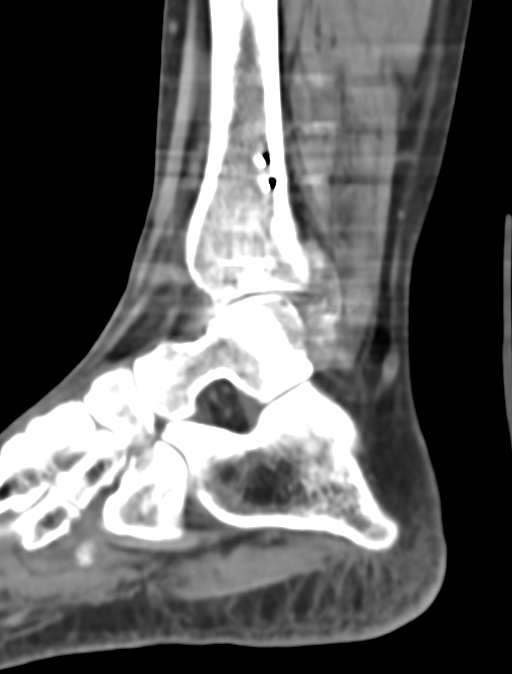
[im 26/52  bone]
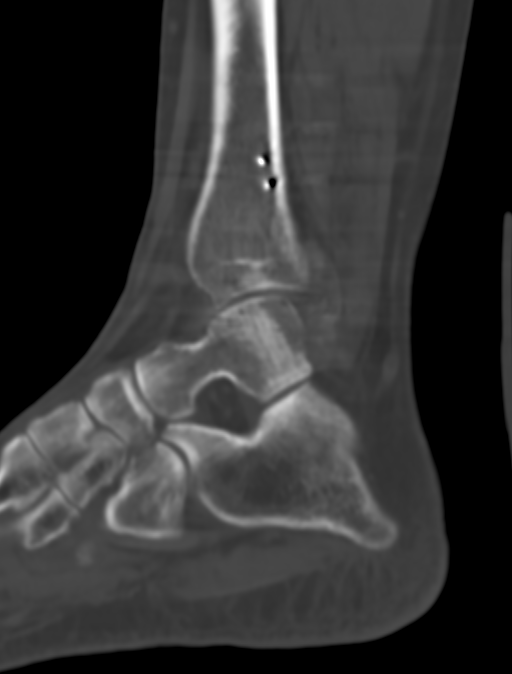
[im 30/52  bone]
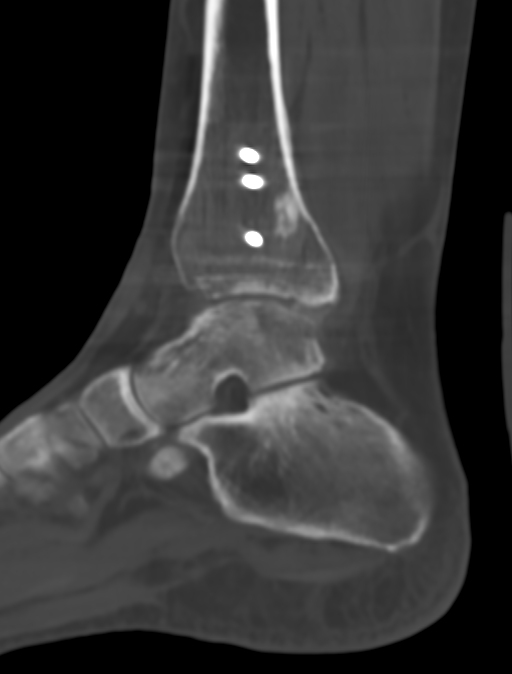
[im 35/52  bone]
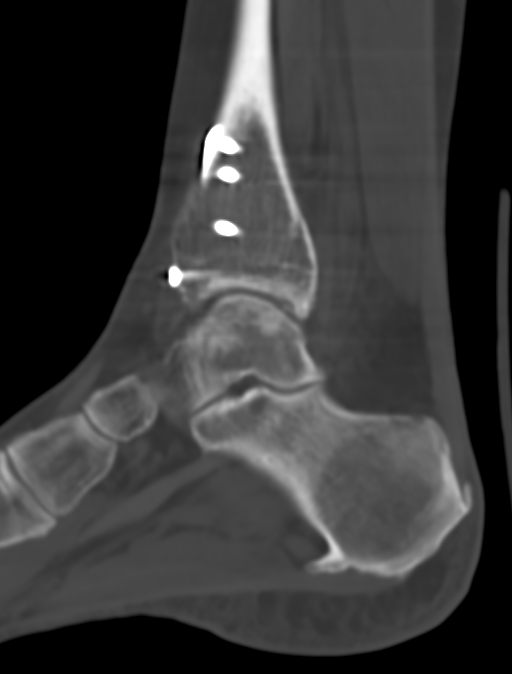

[12 of 33 positions shown; findings below may reference images not displayed]

FINDINGS: Bones/Joint/Cartilage

Remote prior ankle fracture status post ORIF with medial side plate
and screw fixation construct at the distal tibia and a lateral
sideplate and screw fixation construct of the distal fibula.
Hardware intact without evidence of complication. Fractures are well
healed with minimal residual fracture line evident at the posterior
and medial malleolus with mature bridging bone.

No acute fractures. No dislocation. Ankle mortise is congruent with
preservation of the tibiotalar joint space. 5 x 3 mm mineralized
intra-articular body at the posteromedial tibiotalar joint space
(series 11, image 37). No talar dome osteochondral lesion. Subtalar
joint space is maintained.

Ligaments

Suboptimally assessed by CT.

Muscles and Tendons

Ankle tendons including the Achilles tendon appear grossly intact.
No muscular atrophy or fatty infiltration.

Soft tissues

No soft tissue hematoma or fluid collections.
IMPRESSION: 1. Well-healed fractures of the right ankle status post ORIF.
Hardware intact without evidence of complication.
2. Small intra-articular loose body at the posteromedial aspect of
the tibiotalar joint.
3. Bidirectional calcaneal enthesopathy.

## 2021-06-12 ENCOUNTER — Ambulatory Visit: Payer: BC Managed Care – PPO | Admitting: Podiatry

## 2021-06-22 ENCOUNTER — Other Ambulatory Visit: Payer: Self-pay

## 2021-06-22 ENCOUNTER — Ambulatory Visit: Payer: BC Managed Care – PPO | Admitting: Podiatry

## 2021-06-22 DIAGNOSIS — B351 Tinea unguium: Secondary | ICD-10-CM

## 2021-06-22 DIAGNOSIS — M79674 Pain in right toe(s): Secondary | ICD-10-CM | POA: Diagnosis not present

## 2021-06-22 DIAGNOSIS — M79675 Pain in left toe(s): Secondary | ICD-10-CM

## 2021-06-22 DIAGNOSIS — L600 Ingrowing nail: Secondary | ICD-10-CM | POA: Diagnosis not present

## 2021-06-22 NOTE — Patient Instructions (Signed)
Soak Instructions    THE DAY AFTER THE PROCEDURE  Place 1/4 cup of epsom salts in a quart of warm tap water.  Submerge your foot or feet with outer bandage intact for the initial soak; this will allow the bandage to become moist and wet for easy lift off.  Once you remove your bandage, continue to soak in the solution for 20 minutes.  This soak should be done twice a day.  Next, remove your foot or feet from solution, blot dry the affected area and cover.  You may use a band aid large enough to cover the area or use gauze and tape.  Apply other medications to the area as directed by the doctor such as polysporin neosporin.  IF YOUR SKIN BECOMES IRRITATED WHILE USING THESE INSTRUCTIONS, IT IS OKAY TO SWITCH TO  WHITE VINEGAR AND WATER. Or you may use antibacterial soap and water to keep the toe clean  Monitor for any signs/symptoms of infection. Call the office immediately if any occur or go directly to the emergency room. Call with any questions/concerns. Ingrown Toenail An ingrown toenail occurs when the corner or sides of a toenail grow into the surrounding skin. This causes discomfort and pain. The big toe is most commonly affected, but any of the toes can be affected. If an ingrown toenail is nottreated, it can become infected. What are the causes? This condition may be caused by: Wearing shoes that are too small or tight. An injury, such as stubbing your toe or having your toe stepped on. Improper cutting or care of your toenails. Having nail or foot abnormalities that were present from birth (congenital abnormalities), such as having a nail that is too big for your toe. What increases the risk? The following factors may make you more likely to develop ingrown toenails: Age. Nails tend to get thicker with age, so ingrown nails are more common among older people. Cutting your toenails incorrectly, such as cutting them very short or cutting them unevenly. An ingrown toenail is more likely  to get infected if you have: Diabetes. Blood flow (circulation) problems. What are the signs or symptoms? Symptoms of an ingrown toenail may include: Pain, soreness, or tenderness. Redness. Swelling. Hardening of the skin that surrounds the toenail. Signs that an ingrown toenail may be infected include: Fluid or pus. Symptoms that get worse instead of better. How is this diagnosed? An ingrown toenail may be diagnosed based on your medical history, your symptoms, and a physical exam. If you have fluid or blood coming from your toenail, a sample may be collected to test for the specific type of bacteriathat is causing the infection. How is this treated? Treatment depends on how severe your ingrown toenail is. You may be able to care for your toenail at home. If you have an infection, you may be prescribed antibiotic medicines. If you have fluid or pus draining from your toenail, your health care provider may drain it. If you have trouble walking, you may be given crutches to use. If you have a severe or infected ingrown toenail, you may need a procedure to remove part or all of the nail. Follow these instructions at home: Foot care  Do not pick at your toenail or try to remove it yourself. Soak your foot in warm, soapy water. Do this for 20 minutes, 3 times a day, or as often as told by your health care provider. This helps to keep your toe clean and keep your skin soft. Wear  shoes that fit well and are not too tight. Your health care provider may recommend that you wear open-toed shoes while you heal. Trim your toenails regularly and carefully. Cut your toenails straight across to prevent injury to the skin at the corners of the toenail. Do not cut your nails in a curved shape. Keep your feet clean and dry to help prevent infection.  Medicines Take over-the-counter and prescription medicines only as told by your health care provider. If you were prescribed an antibiotic, take it as told  by your health care provider. Do not stop taking the antibiotic even if you start to feel better. Activity Return to your normal activities as told by your health care provider. Ask your health care provider what activities are safe for you. Avoid activities that cause pain. General instructions If your health care provider told you to use crutches to help you move around, use them as instructed. Keep all follow-up visits as told by your health care provider. This is important. Contact a health care provider if: You have more redness, swelling, pain, or other symptoms that do not improve with treatment. You have fluid, blood, or pus coming from your toenail. Get help right away if: You have a red streak on your skin that starts at your foot and spreads up your leg. You have a fever. Summary An ingrown toenail occurs when the corner or sides of a toenail grow into the surrounding skin. This causes discomfort and pain. The big toe is most commonly affected, but any of the toes can be affected. If an ingrown toenail is not treated, it can become infected. Fluid or pus draining from your toenail is a sign of infection. Your health care provider may need to drain it. You may be given antibiotics to treat the infection. Trimming your toenails regularly and properly can help you prevent an ingrown toenail. This information is not intended to replace advice given to you by your health care provider. Make sure you discuss any questions you have with your healthcare provider. Document Revised: 03/13/2019 Document Reviewed: 08/07/2017 Elsevier Patient Education  2021 [employer].

## 2021-06-26 NOTE — Progress Notes (Signed)
Subjective: 55 year old female presents the office today for concerns of toenails becoming thickened discolored and also her toenails becoming ingrown along both corners of both big toes.  No drainage or pus or any swelling or redness.  There is tender with pressure. Denies any systemic complaints such as fevers, chills, nausea, vomiting. No acute changes since last appointment, and no other complaints at this time.   Objective: AAO x3, NAD DP/PT pulses palpable bilaterally, CRT less than 3 seconds Health nails are hypertrophic, dystrophic with brown discoloration.  Incurvation of the nails present with tenderness palpation along the nail borders.  No edema, erythema, drainage or pus or any signs of infection.  No pain with calf compression, swelling, warmth, erythema  Assessment: Onychodystrophy with ingrown toenails  Plan: -All treatment options discussed with the patient including all alternatives, risks, complications.  -Regards to the nail discomfort I discussed partial nail avulsion with chemical matricectomy.  She wants to proceed with this but she is going to go on vacation.  We will plan to do this when she gets back. -We will send the nail for culture after the procedure -Patient encouraged to call the office with any questions, concerns, change in symptoms.   Trula Slade DPM

## 2021-07-17 ENCOUNTER — Encounter: Payer: Self-pay | Admitting: Podiatry

## 2021-07-17 ENCOUNTER — Ambulatory Visit: Payer: BC Managed Care – PPO | Admitting: Podiatry

## 2021-07-17 ENCOUNTER — Other Ambulatory Visit: Payer: Self-pay

## 2021-07-17 DIAGNOSIS — M79675 Pain in left toe(s): Secondary | ICD-10-CM

## 2021-07-17 DIAGNOSIS — L603 Nail dystrophy: Secondary | ICD-10-CM | POA: Diagnosis not present

## 2021-07-17 DIAGNOSIS — B351 Tinea unguium: Secondary | ICD-10-CM

## 2021-07-17 DIAGNOSIS — M79674 Pain in right toe(s): Secondary | ICD-10-CM

## 2021-07-17 DIAGNOSIS — L600 Ingrowing nail: Secondary | ICD-10-CM

## 2021-07-17 NOTE — Patient Instructions (Addendum)

## 2021-07-19 NOTE — Progress Notes (Signed)
Subjective: 55 year old female presents the office today for concerns of ingrown toenails with her big toes and she wants to have the procedure in order to remove them as are causing discomfort.  She was soaking in salt water which is been helpful and she denies any swelling or redness or any drainage at this time. Denies any systemic complaints such as fevers, chills, nausea, vomiting. No acute changes since last appointment, and no other complaints at this time.   Objective: AAO x3, NAD DP/PT pulses palpable bilaterally, CRT less than 3 seconds Incurvation present to both the medial lateral aspects of bilateral hallux toenails without any edema, erythema or any signs of infection.  Tenderness palpation the nail borders.  No other areas of discomfort.  The nails are mildly dystrophic with slight yellow discoloration of the nail borders.  No pain with calf compression, swelling, warmth, erythema  Assessment: 55 year old female ingrown toenails bilaterally, possible onychomycosis  Plan: -All treatment options discussed with the patient including all alternatives, risks, complications.  -At this time, the patient is requesting partial nail removal with chemical matricectomy to the symptomatic portion of the nail. Risks and complications were discussed with the patient for which they understand and written consent was obtained. Under sterile conditions a total of 3 mL of a mixture of 2% lidocaine plain and 0.5% Marcaine plain was infiltrated in a hallux block fashion. Once anesthetized, the skin was prepped in sterile fashion. A tourniquet was then applied. Next the medial and lateral aspect of hallux nail border was then sharply excised making sure to remove the entire offending nail border. Once the nails were ensured to be removed area was debrided and the underlying skin was intact. There is no purulence identified in the procedure. Next phenol was then applied under standard conditions and copiously  irrigated. Silvadene was applied. A dry sterile dressing was applied. After application of the dressing the tourniquet was removed and there is found to be an immediate capillary refill time to the digit. The patient tolerated the procedure well any complications. Post procedure instructions were discussed the patient for which he verbally understood. Follow-up in one week for nail check or sooner if any problems are to arise. Discussed signs/symptoms of infection and directed to call the office immediately should any occur or go directly to the emergency room. In the meantime, encouraged to call the office with any questions, concerns, changes symptoms. -Discussed treatment options for nail fungus but will see if the nail culture, biopsy results show which I sent to [facility] today.  -Patient encouraged to call the office with any questions, concerns, change in symptoms.   Trula Slade DPM

## 2021-08-01 ENCOUNTER — Other Ambulatory Visit: Payer: Self-pay

## 2021-08-01 ENCOUNTER — Ambulatory Visit: Payer: BC Managed Care – PPO | Admitting: Podiatry

## 2021-08-01 ENCOUNTER — Encounter: Payer: Self-pay | Admitting: Podiatry

## 2021-08-01 DIAGNOSIS — L600 Ingrowing nail: Secondary | ICD-10-CM | POA: Diagnosis not present

## 2021-08-01 DIAGNOSIS — B351 Tinea unguium: Secondary | ICD-10-CM | POA: Diagnosis not present

## 2021-08-08 NOTE — Progress Notes (Signed)
Subjective: 55 year old female presents the office today for follow-up evaluation of ingrown toenails as well as for nail culture results.  She states the nail sites are doing better.  Denies any drainage or pus.  She has stopped soaking.  No pain.   Objective: AAO x3, NAD DP/PT pulses palpable bilaterally, CRT less than 3 seconds Status post partial nail avulsion bilaterally.  Small emulation tissue and scab is still present left side as well as the right.  On the right proximal nail base there is a small amount of erythema but there is no ascending cellulitis.  No drainage or pus or other signs of infection.  No pain with calf compression, swelling, warmth, erythema  Assessment:  55 year old female with status post ingrown toenail removal, onychomycosis  Plan: -All treatment options discussed with the patient including all alternatives, risks, complications.  -I recommend going back to Epson salt soaks as well as cover with a small amount of antibiotic ointment during the day leave the area open at nighttime.  Monitor the redness on the right side Any worsening let me know if is not resolved in the next 2 weeks. -Reviewed nail culture results with her.  Discussed her medications patient was to try natural remedies first.  Discussed biotin supplement as well as different topical that she can apply more naturally.  If no improvement will consider medications. -Patient encouraged to call the office with any questions, concerns, change in symptoms.   Trula Slade DPM

## 2021-09-15 DIAGNOSIS — Z124 Encounter for screening for malignant neoplasm of cervix: Secondary | ICD-10-CM | POA: Diagnosis not present

## 2021-09-15 DIAGNOSIS — Z1231 Encounter for screening mammogram for malignant neoplasm of breast: Secondary | ICD-10-CM | POA: Diagnosis not present

## 2021-09-15 DIAGNOSIS — Z113 Encounter for screening for infections with a predominantly sexual mode of transmission: Secondary | ICD-10-CM | POA: Diagnosis not present

## 2021-09-15 DIAGNOSIS — Z01419 Encounter for gynecological examination (general) (routine) without abnormal findings: Secondary | ICD-10-CM | POA: Diagnosis not present

## 2021-09-15 DIAGNOSIS — Z6821 Body mass index (BMI) 21.0-21.9, adult: Secondary | ICD-10-CM | POA: Diagnosis not present

## 2022-01-04 DIAGNOSIS — L720 Epidermal cyst: Secondary | ICD-10-CM | POA: Diagnosis not present

## 2022-01-04 DIAGNOSIS — L821 Other seborrheic keratosis: Secondary | ICD-10-CM | POA: Diagnosis not present

## 2022-01-04 DIAGNOSIS — L814 Other melanin hyperpigmentation: Secondary | ICD-10-CM | POA: Diagnosis not present

## 2022-01-04 DIAGNOSIS — D2372 Other benign neoplasm of skin of left lower limb, including hip: Secondary | ICD-10-CM | POA: Diagnosis not present

## 2022-02-05 DIAGNOSIS — R300 Dysuria: Secondary | ICD-10-CM | POA: Diagnosis not present

## 2022-02-05 DIAGNOSIS — N390 Urinary tract infection, site not specified: Secondary | ICD-10-CM | POA: Diagnosis not present

## 2022-02-05 DIAGNOSIS — T3695XA Adverse effect of unspecified systemic antibiotic, initial encounter: Secondary | ICD-10-CM | POA: Diagnosis not present

## 2022-02-05 DIAGNOSIS — B379 Candidiasis, unspecified: Secondary | ICD-10-CM | POA: Diagnosis not present

## 2022-10-02 DIAGNOSIS — Z13220 Encounter for screening for lipoid disorders: Secondary | ICD-10-CM | POA: Diagnosis not present

## 2022-10-02 DIAGNOSIS — Z1329 Encounter for screening for other suspected endocrine disorder: Secondary | ICD-10-CM | POA: Diagnosis not present

## 2022-10-02 DIAGNOSIS — Z1231 Encounter for screening mammogram for malignant neoplasm of breast: Secondary | ICD-10-CM | POA: Diagnosis not present

## 2022-10-02 DIAGNOSIS — Z01419 Encounter for gynecological examination (general) (routine) without abnormal findings: Secondary | ICD-10-CM | POA: Diagnosis not present

## 2022-10-02 DIAGNOSIS — Z0000 Encounter for general adult medical examination without abnormal findings: Secondary | ICD-10-CM | POA: Diagnosis not present

## 2022-10-02 DIAGNOSIS — Z6823 Body mass index (BMI) 23.0-23.9, adult: Secondary | ICD-10-CM | POA: Diagnosis not present

## 2022-10-02 DIAGNOSIS — Z131 Encounter for screening for diabetes mellitus: Secondary | ICD-10-CM | POA: Diagnosis not present

## 2023-02-11 DIAGNOSIS — D225 Melanocytic nevi of trunk: Secondary | ICD-10-CM | POA: Diagnosis not present

## 2023-02-11 DIAGNOSIS — D2262 Melanocytic nevi of left upper limb, including shoulder: Secondary | ICD-10-CM | POA: Diagnosis not present

## 2023-02-11 DIAGNOSIS — L720 Epidermal cyst: Secondary | ICD-10-CM | POA: Diagnosis not present

## 2023-02-11 DIAGNOSIS — D2261 Melanocytic nevi of right upper limb, including shoulder: Secondary | ICD-10-CM | POA: Diagnosis not present

## 2023-04-16 DIAGNOSIS — Z23 Encounter for immunization: Secondary | ICD-10-CM | POA: Diagnosis not present

## 2023-04-16 DIAGNOSIS — E559 Vitamin D deficiency, unspecified: Secondary | ICD-10-CM | POA: Diagnosis not present

## 2023-04-16 DIAGNOSIS — Z0000 Encounter for general adult medical examination without abnormal findings: Secondary | ICD-10-CM | POA: Diagnosis not present

## 2023-04-16 DIAGNOSIS — E611 Iron deficiency: Secondary | ICD-10-CM | POA: Diagnosis not present

## 2023-04-16 DIAGNOSIS — E7800 Pure hypercholesterolemia, unspecified: Secondary | ICD-10-CM | POA: Diagnosis not present

## 2023-04-17 ENCOUNTER — Other Ambulatory Visit: Payer: Self-pay | Admitting: Family Medicine

## 2023-04-17 DIAGNOSIS — E2839 Other primary ovarian failure: Secondary | ICD-10-CM

## 2023-06-12 ENCOUNTER — Ambulatory Visit: Payer: BC Managed Care – PPO | Admitting: Sports Medicine

## 2023-06-12 VITALS — BP 110/70 | Ht 69.0 in | Wt 160.0 lb

## 2023-06-12 DIAGNOSIS — R269 Unspecified abnormalities of gait and mobility: Secondary | ICD-10-CM

## 2023-06-12 NOTE — Assessment & Plan Note (Signed)
Given series of general strength exercises to prepare for marathon  Use of sports insoles with scaphoid pads appears adequate  If problems arise we will move to custom orthotics

## 2023-06-12 NOTE — Progress Notes (Signed)
PCP: Camie Patience, FNP  Subjective:   HPI: Patient is a 57 y.o. female here for gait analysis.  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  She has previously done half marathons.  Denies any acute or recent pain.  She does point out that she has calluses that form on the insides of her great toes bilaterally when she runs.  She has a history of right ankle fracture.  She uses Dr. Margart Sickles inserts in her shoes for arch support.  No past medical history on file.  Current Outpatient Medications on File Prior to Visit  Medication Sig Dispense Refill   cholecalciferol (VITAMIN D3) 25 MCG (1000 UT) tablet Take 1,000 Units by mouth daily.     nitrofurantoin, macrocrystal-monohydrate, (MACROBID) 100 MG capsule Macrobid 100 mg capsule  Take 1 capsule every 12 hours by oral route for 3 days.     No current facility-administered medications on file prior to visit.    No past surgical history on file.  No Known Allergies  BP 110/70   Ht 5\' 9"  (1.753 m)   Wt 160 lb (72.6 kg)   BMI 23.63 kg/m       No data to display              No data to display              Objective:  Physical Exam:  Gen: NAD, comfortable in exam room  Bilateral hips: 5/5 strength Full range of motion  Bilateral feet: Superficial abrasions/calluses noted on the medial surface of bilateral great toes.  No evidence of hallux valgus bilaterally. Appears to have cavus foot bilaterally with high arches Full range of motion of the ankle and midfoot.  Nontender to palpation throughout.  Sensation and pulses intact  Running gait shows evidence of slight forefoot overpronation bilaterally, worse on her right   Assessment & Plan:  1.  Overpronation -her gait seems to be affected by the shape of her feet/arches resulting in overpronation.  This is evidenced by the calluses forming on the inside of her great toes bilaterally.  Provided patient with insoles with arch support; use of these  new insoles improved her gait upon repeat analysis.  Also provided patient with exercises to strengthen her thigh/hip muscles to further reinforce her gait as she trains for the marathon.  Follow-up as needed  I observed and examined the patient with the resident and agree with assessment and plan.  Note reviewed and modified by me. KB Fields. MD

## 2023-07-15 DIAGNOSIS — E559 Vitamin D deficiency, unspecified: Secondary | ICD-10-CM | POA: Diagnosis not present

## 2023-07-15 DIAGNOSIS — Z23 Encounter for immunization: Secondary | ICD-10-CM | POA: Diagnosis not present

## 2023-07-15 DIAGNOSIS — E611 Iron deficiency: Secondary | ICD-10-CM | POA: Diagnosis not present

## 2023-07-15 DIAGNOSIS — E7800 Pure hypercholesterolemia, unspecified: Secondary | ICD-10-CM | POA: Diagnosis not present

## 2023-07-23 ENCOUNTER — Other Ambulatory Visit (HOSPITAL_COMMUNITY): Payer: Self-pay | Admitting: Family Medicine

## 2023-07-23 DIAGNOSIS — E7800 Pure hypercholesterolemia, unspecified: Secondary | ICD-10-CM

## 2023-08-01 ENCOUNTER — Ambulatory Visit (HOSPITAL_BASED_OUTPATIENT_CLINIC_OR_DEPARTMENT_OTHER)
Admission: RE | Admit: 2023-08-01 | Discharge: 2023-08-01 | Disposition: A | Discharge status: Home / Self Care | Payer: BC Managed Care – PPO | Source: Ambulatory Visit | Attending: Family Medicine | Admitting: Family Medicine

## 2023-08-01 DIAGNOSIS — E7800 Pure hypercholesterolemia, unspecified: Secondary | ICD-10-CM | POA: Insufficient documentation

## 2023-08-09 DIAGNOSIS — M25561 Pain in right knee: Secondary | ICD-10-CM | POA: Diagnosis not present

## 2023-08-09 DIAGNOSIS — M25562 Pain in left knee: Secondary | ICD-10-CM | POA: Diagnosis not present

## 2023-10-04 DIAGNOSIS — Z1331 Encounter for screening for depression: Secondary | ICD-10-CM | POA: Diagnosis not present

## 2023-10-04 DIAGNOSIS — Z01419 Encounter for gynecological examination (general) (routine) without abnormal findings: Secondary | ICD-10-CM | POA: Diagnosis not present

## 2023-10-04 DIAGNOSIS — E559 Vitamin D deficiency, unspecified: Secondary | ICD-10-CM | POA: Diagnosis not present

## 2023-10-04 DIAGNOSIS — Z1231 Encounter for screening mammogram for malignant neoplasm of breast: Secondary | ICD-10-CM | POA: Diagnosis not present

## 2023-10-04 DIAGNOSIS — Z130 Encounter for screening for diseases of the blood and blood-forming organs and certain disorders involving the immune mechanism: Secondary | ICD-10-CM | POA: Diagnosis not present

## 2023-10-15 ENCOUNTER — Ambulatory Visit (INDEPENDENT_AMBULATORY_CARE_PROVIDER_SITE_OTHER): Payer: BC Managed Care – PPO | Admitting: Orthopaedic Surgery

## 2023-10-15 ENCOUNTER — Other Ambulatory Visit (INDEPENDENT_AMBULATORY_CARE_PROVIDER_SITE_OTHER): Payer: BC Managed Care – PPO

## 2023-10-15 ENCOUNTER — Other Ambulatory Visit (INDEPENDENT_AMBULATORY_CARE_PROVIDER_SITE_OTHER): Payer: Self-pay

## 2023-10-15 ENCOUNTER — Encounter: Payer: Self-pay | Admitting: Orthopaedic Surgery

## 2023-10-15 DIAGNOSIS — M25522 Pain in left elbow: Secondary | ICD-10-CM | POA: Diagnosis not present

## 2023-10-15 NOTE — Progress Notes (Signed)
Office Visit Note   Patient: Julia Long           Date of Birth: 26-Apr-1966           MRN: [id number] Visit Date: 10/15/2023              Requested by: Camie Patience, FNP [address] [place],  [place] [address] PCP: Camie Patience, FNP   Assessment & Plan: Visit Diagnoses:  1. Pain in left elbow     Plan: Julia Long is a 57 year old female with left elbow pain.  Impression is that she has either an occult radial head fracture or avulsion fracture of the lateral epicondyle.  This can be treated conservatively and symptomatically.  Activity restrictions reviewed.  I would like to recheck her in 3 weeks with three-view x-rays of the left elbow.  Follow-Up Instructions: Return in about 3 weeks (around 11/05/2023).   Orders:  Orders Placed This Encounter  Procedures   XR Elbow 2 Views Left   XR Forearm Left   No orders of the defined types were placed in this encounter.     Procedures: No procedures performed   Clinical Data: No additional findings.   Subjective: Chief Complaint  Patient presents with   Left Shoulder - Pain    HPI Julia Long is a 57 year old female here for evaluation of left upper extremity pain.  She was running a half marathon in [place] 3 and half weeks ago when she fell.  She had x-rays which were reportedly negative.  She has pain that radiates down the arm when she holds or grasps anything.  She has been taking over-the-counter medications.  Review of Systems  Constitutional: Negative.   HENT: Negative.    Eyes: Negative.   Respiratory: Negative.    Cardiovascular: Negative.   Endocrine: Negative.   Musculoskeletal: Negative.   Neurological: Negative.   Hematological: Negative.   Psychiatric/Behavioral: Negative.    All other systems reviewed and are negative.    Objective: Vital Signs: There were no vitals taken for this visit.  Physical Exam Vitals and nursing note reviewed.  Constitutional:      Appearance:  She is well-developed.  HENT:     Head: Atraumatic.     Nose: Nose normal.  Eyes:     Extraocular Movements: Extraocular movements intact.  Cardiovascular:     Pulses: Normal pulses.  Pulmonary:     Effort: Pulmonary effort is normal.  Abdominal:     Palpations: Abdomen is soft.  Musculoskeletal:     Cervical back: Neck supple.  Skin:    General: Skin is warm.     Capillary Refill: Capillary refill takes less than 2 seconds.  Neurological:     Mental Status: She is alert. Mental status is at baseline.  Psychiatric:        Behavior: Behavior normal.        Thought Content: Thought content normal.        Judgment: Judgment normal.     Ortho Exam Exam of the left upper extremity shows a normal shoulder exam with excellent strength.  Exam of the left elbow shows mild limitation with extension and flexion and pronation supination with radiation of pain up into the bicep and down to the forearm.  She has slight tenderness to the radial head and lateral epicondyle. Specialty Comments:  No specialty comments available.  Imaging: XR Elbow 2 Views Left  Result Date: 10/15/2023 X-rays of the left elbow show questionable nondisplaced  radial head fracture and possibly a lateral epicondyle avulsion fracture  XR Forearm Left  Result Date: 10/15/2023 X-rays of the forearm show questionable lateral epicondyle avulsion fracture.    PMFS History: Patient Active Problem List   Diagnosis Date Noted   Abnormality of gait 06/12/2023   Plantar fasciitis 07/09/2018   History reviewed. No pertinent past medical history.  Family History  Problem Relation Age of Onset   Heart attack Maternal Grandfather     History reviewed. No pertinent surgical history. Social History   Occupational History   Not on file  Tobacco Use   Smoking status: Never   Smokeless tobacco: Never  Substance and Sexual Activity   Alcohol use: Yes    Alcohol/week: 2.0 standard drinks of alcohol    Types: 2  Glasses of wine per week   Drug use: Not on file   Sexual activity: Not on file

## 2023-11-05 ENCOUNTER — Ambulatory Visit (INDEPENDENT_AMBULATORY_CARE_PROVIDER_SITE_OTHER): Payer: BC Managed Care – PPO | Admitting: Orthopaedic Surgery

## 2023-11-05 ENCOUNTER — Encounter: Payer: Self-pay | Admitting: Orthopaedic Surgery

## 2023-11-05 ENCOUNTER — Other Ambulatory Visit (INDEPENDENT_AMBULATORY_CARE_PROVIDER_SITE_OTHER): Payer: Self-pay

## 2023-11-05 DIAGNOSIS — M25522 Pain in left elbow: Secondary | ICD-10-CM

## 2023-11-05 NOTE — Progress Notes (Signed)
   Office Visit Note   Patient: Julia Long           Date of Birth: 1966/01/19           MRN: [id number] Visit Date: 11/05/2023              Requested by: Camie Patience, FNP [address] [place],  [place] [address] PCP: Camie Patience, FNP   Assessment & Plan: Visit Diagnoses:  1. Pain in left elbow     Plan: [name] is now 6 weeks from the injury and overall has felt significant improvement.  At this point she can advance activity as tolerated.  Follow-Up Instructions: No follow-ups on file.   Orders:  Orders Placed This Encounter  Procedures   XR Elbow Complete Left (3+View)   No orders of the defined types were placed in this encounter.     Procedures: No procedures performed   Clinical Data: No additional findings.   Subjective: Chief Complaint  Patient presents with   Left Elbow - Follow-up    HPI And returns today for 6-week follow-up visit status post left elbow injury.  She is feeling much better. Review of Systems   Objective: Vital Signs: There were no vitals taken for this visit.  Physical Exam  Ortho Exam Exam of the left elbow shows near full range of motion in supination and pronation.  No tenderness to the radiocapitellar joint. Specialty Comments:  No specialty comments available.  Imaging: XR Elbow Complete Left (3+View)  Result Date: 11/05/2023 Left elbow x-rays show evidence of fracture healing around the radial head and neck region consistent with suspected occult radial head fracture    PMFS History: Patient Active Problem List   Diagnosis Date Noted   Abnormality of gait 06/12/2023   Plantar fasciitis 07/09/2018   History reviewed. No pertinent past medical history.  Family History  Problem Relation Age of Onset   Heart attack Maternal Grandfather     History reviewed. No pertinent surgical history. Social History   Occupational History   Not on file  Tobacco Use   Smoking status: Never    Smokeless tobacco: Never  Substance and Sexual Activity   Alcohol use: Yes    Alcohol/week: 2.0 standard drinks of alcohol    Types: 2 Glasses of wine per week   Drug use: Not on file   Sexual activity: Not on file

## 2023-11-11 ENCOUNTER — Ambulatory Visit
Admission: RE | Admit: 2023-11-11 | Discharge: 2023-11-11 | Disposition: A | Discharge status: Home / Self Care | Payer: BC Managed Care – PPO | Source: Ambulatory Visit | Attending: Family Medicine | Admitting: Family Medicine

## 2023-11-11 DIAGNOSIS — E2839 Other primary ovarian failure: Secondary | ICD-10-CM | POA: Diagnosis not present

## 2023-11-11 DIAGNOSIS — N958 Other specified menopausal and perimenopausal disorders: Secondary | ICD-10-CM | POA: Diagnosis not present

## 2023-11-11 DIAGNOSIS — M8588 Other specified disorders of bone density and structure, other site: Secondary | ICD-10-CM | POA: Diagnosis not present

## 2024-10-05 ENCOUNTER — Encounter: Payer: Self-pay | Admitting: Radiology
